# Patient Record
Sex: Male | Born: 2000 | Race: Black or African American | Hispanic: No | Marital: Single | Smoking: Former smoker
Health system: Southern US, Community
[De-identification: ages and names within clinical notes are randomized; demographics above are authoritative.]

## PROBLEM LIST (undated history)

## (undated) DIAGNOSIS — F913 Oppositional defiant disorder: Secondary | ICD-10-CM

## (undated) DIAGNOSIS — J45909 Unspecified asthma, uncomplicated: Secondary | ICD-10-CM

## (undated) DIAGNOSIS — F909 Attention-deficit hyperactivity disorder, unspecified type: Secondary | ICD-10-CM

## (undated) HISTORY — PX: TONSILLECTOMY: SUR1361

---
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
## 2020-10-01 ENCOUNTER — Other Ambulatory Visit: Payer: Self-pay

## 2020-10-01 ENCOUNTER — Emergency Department
Admission: EM | Admit: 2020-10-01 | Discharge: 2020-10-01 | Disposition: A | Discharge status: Home / Self Care | Payer: Medicaid Other | Attending: Emergency Medicine | Admitting: Emergency Medicine

## 2020-10-01 DIAGNOSIS — Z91040 Latex allergy status: Secondary | ICD-10-CM | POA: Diagnosis not present

## 2020-10-01 DIAGNOSIS — Z79899 Other long term (current) drug therapy: Secondary | ICD-10-CM | POA: Diagnosis not present

## 2020-10-01 DIAGNOSIS — Z202 Contact with and (suspected) exposure to infections with a predominantly sexual mode of transmission: Secondary | ICD-10-CM | POA: Insufficient documentation

## 2020-10-01 DIAGNOSIS — Z87891 Personal history of nicotine dependence: Secondary | ICD-10-CM | POA: Insufficient documentation

## 2020-10-01 DIAGNOSIS — J45909 Unspecified asthma, uncomplicated: Secondary | ICD-10-CM | POA: Diagnosis not present

## 2020-10-01 LAB — URINALYSIS, COMPLETE (UACMP) WITH MICROSCOPIC
Bacteria, UA: NONE SEEN
Bilirubin Urine: NEGATIVE
Glucose, UA: NEGATIVE mg/dL
Hgb urine dipstick: NEGATIVE
Ketones, ur: NEGATIVE mg/dL
Nitrite: NEGATIVE
Protein, ur: NEGATIVE mg/dL
Specific Gravity, Urine: 1.014 (ref 1.005–1.030)
Squamous Epithelial / LPF: NONE SEEN (ref 0–5)
pH: 7 (ref 5.0–8.0)

## 2020-10-01 LAB — CHLAMYDIA/NGC RT PCR (ARMC ONLY)
Chlamydia Tr: NOT DETECTED
N gonorrhoeae: NOT DETECTED

## 2020-10-01 MED ORDER — LIDOCAINE HCL (PF) 1 % IJ SOLN
5.0000 mL | Freq: Once | INTRAMUSCULAR | Status: AC
  Administered 2020-10-01: 5 mL
  Filled 2020-10-01: qty 5

## 2020-10-01 MED ORDER — CEFTRIAXONE SODIUM 1 G IJ SOLR
500.0000 mg | Freq: Once | INTRAMUSCULAR | Status: AC
  Administered 2020-10-01: 500 mg via INTRAMUSCULAR
  Filled 2020-10-01: qty 10

## 2020-10-01 MED ORDER — AZITHROMYCIN 1 G PO PACK: 1.0000 g | PACK | Freq: Once | ORAL | Status: DC

## 2020-10-01 MED ORDER — AZITHROMYCIN 500 MG PO TABS
ORAL_TABLET | ORAL | Status: AC
  Administered 2020-10-01: 1000 mg via ORAL
  Filled 2020-10-01: qty 2

## 2020-10-01 MED ORDER — AZITHROMYCIN 500 MG PO TABS
1000.0000 mg | ORAL_TABLET | Freq: Once | ORAL | Status: AC
Start: 2020-10-01 — End: 2020-10-01
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
## 2020-10-01 NOTE — Discharge Instructions (Addendum)
You have been treated for STDs with medication provided in the ED.  You may continue to monitor your pending lab results at this time.  Follow-up with [facility] health for ongoing testing.
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
## 2021-01-01 ENCOUNTER — Other Ambulatory Visit: Payer: Self-pay

## 2021-01-01 ENCOUNTER — Emergency Department (HOSPITAL_BASED_OUTPATIENT_CLINIC_OR_DEPARTMENT_OTHER)
Admission: EM | Admit: 2021-01-01 | Discharge: 2021-01-01 | Disposition: A | Discharge status: Home / Self Care | Payer: Medicaid Other | Attending: Emergency Medicine | Admitting: Emergency Medicine

## 2021-01-01 ENCOUNTER — Emergency Department (HOSPITAL_BASED_OUTPATIENT_CLINIC_OR_DEPARTMENT_OTHER): Payer: Medicaid Other

## 2021-01-01 ENCOUNTER — Encounter (HOSPITAL_BASED_OUTPATIENT_CLINIC_OR_DEPARTMENT_OTHER): Payer: Self-pay | Admitting: *Deleted

## 2021-01-01 DIAGNOSIS — S0990XA Unspecified injury of head, initial encounter: Secondary | ICD-10-CM | POA: Diagnosis present

## 2021-01-01 DIAGNOSIS — Y92410 Unspecified street and highway as the place of occurrence of the external cause: Secondary | ICD-10-CM | POA: Insufficient documentation

## 2021-01-01 DIAGNOSIS — Z91040 Latex allergy status: Secondary | ICD-10-CM | POA: Diagnosis not present

## 2021-01-01 DIAGNOSIS — J45909 Unspecified asthma, uncomplicated: Secondary | ICD-10-CM | POA: Insufficient documentation

## 2021-01-01 DIAGNOSIS — Z87891 Personal history of nicotine dependence: Secondary | ICD-10-CM | POA: Insufficient documentation

## 2021-01-01 MED ORDER — CYCLOBENZAPRINE HCL 10 MG PO TABS: 10.0000 mg | ORAL_TABLET | Freq: Two times a day (BID) | ORAL | 0 refills | Status: DC | PRN

## 2021-01-01 MED ORDER — IBUPROFEN 600 MG PO TABS: 600.0000 mg | ORAL_TABLET | Freq: Four times a day (QID) | ORAL | 0 refills | Status: DC | PRN
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
## 2021-01-01 NOTE — ED Notes (Signed)
Patient transported to CT at this Time. ?

## 2021-01-01 NOTE — ED Notes (Signed)
Patient returned from CT at this Time. ?

## 2021-01-01 NOTE — ED Notes (Signed)
This RN presented the AVS utilizing Teachback Method. Patient verbalizes understanding of Discharge Instructions. Opportunity for Questioning and Answers were provided. Patient Discharged from ED ambulatory to Home via SELF  
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
## 2021-04-24 ENCOUNTER — Other Ambulatory Visit: Payer: Self-pay

## 2021-04-24 ENCOUNTER — Emergency Department (HOSPITAL_COMMUNITY): Payer: Medicaid Other

## 2021-04-24 ENCOUNTER — Emergency Department (HOSPITAL_COMMUNITY)
Admission: EM | Admit: 2021-04-24 | Discharge: 2021-04-25 | Disposition: A | Discharge status: Home / Self Care | Payer: Medicaid Other | Attending: Emergency Medicine | Admitting: Emergency Medicine

## 2021-04-24 ENCOUNTER — Encounter (HOSPITAL_COMMUNITY): Payer: Self-pay | Admitting: Emergency Medicine

## 2021-04-24 DIAGNOSIS — M542 Cervicalgia: Secondary | ICD-10-CM | POA: Diagnosis not present

## 2021-04-24 DIAGNOSIS — R0789 Other chest pain: Secondary | ICD-10-CM | POA: Diagnosis not present

## 2021-04-24 DIAGNOSIS — Y92410 Unspecified street and highway as the place of occurrence of the external cause: Secondary | ICD-10-CM | POA: Insufficient documentation

## 2021-04-24 DIAGNOSIS — M549 Dorsalgia, unspecified: Secondary | ICD-10-CM | POA: Insufficient documentation

## 2021-04-24 DIAGNOSIS — R519 Headache, unspecified: Secondary | ICD-10-CM | POA: Diagnosis not present

## 2021-04-24 DIAGNOSIS — R109 Unspecified abdominal pain: Secondary | ICD-10-CM | POA: Insufficient documentation

## 2021-04-24 LAB — COMPREHENSIVE METABOLIC PANEL
ALT: 13 U/L (ref 0–44)
AST: 20 U/L (ref 15–41)
Albumin: 4.5 g/dL (ref 3.5–5.0)
Alkaline Phosphatase: 43 U/L (ref 38–126)
Anion gap: 7 (ref 5–15)
BUN: 7 mg/dL (ref 6–20)
CO2: 26 mmol/L (ref 22–32)
Calcium: 9 mg/dL (ref 8.9–10.3)
Chloride: 102 mmol/L (ref 98–111)
Creatinine, Ser: 1.12 mg/dL (ref 0.61–1.24)
GFR, Estimated: >60 mL/min (ref >60)
Glucose, Bld: 79 mg/dL (ref 70–99)
Potassium: 3.6 mmol/L (ref 3.5–5.1)
Sodium: 135 mmol/L (ref 135–145)
Total Bilirubin: 0.6 mg/dL (ref 0.3–1.2)
Total Protein: 7.3 g/dL (ref 6.5–8.1)

## 2021-04-24 LAB — CBC WITH DIFFERENTIAL/PLATELET
Abs Immature Granulocytes: 0.01 10*3/uL (ref 0.00–0.07)
Basophils Absolute: 0 10*3/uL (ref 0.0–0.1)
Basophils Relative: 1 %
Eosinophils Absolute: 0.1 10*3/uL (ref 0.0–0.5)
Eosinophils Relative: 2 %
HCT: 45.4 % (ref 39.0–52.0)
Hemoglobin: 14.9 g/dL (ref 13.0–17.0)
Immature Granulocytes: 0 %
Lymphocytes Relative: 42 %
Lymphs Abs: 2.1 10*3/uL (ref 0.7–4.0)
MCH: 28.3 pg (ref 26.0–34.0)
MCHC: 32.8 g/dL (ref 30.0–36.0)
MCV: 86.1 fL (ref 80.0–100.0)
Monocytes Absolute: 0.3 10*3/uL (ref 0.1–1.0)
Monocytes Relative: 7 %
Neutro Abs: 2.4 10*3/uL (ref 1.7–7.7)
Neutrophils Relative %: 48 %
Platelets: 188 10*3/uL (ref 150–400)
RBC: 5.27 MIL/uL (ref 4.22–5.81)
RDW: 13.4 % (ref 11.5–15.5)
WBC: 5 10*3/uL (ref 4.0–10.5)
nRBC: 0 % (ref 0.0–0.2)

## 2021-04-24 MED ORDER — KETOROLAC TROMETHAMINE 15 MG/ML IJ SOLN
15.0000 mg | Freq: Once | INTRAMUSCULAR | Status: AC
Start: 2021-04-24 — End: 2021-04-24
  Administered 2021-04-24: 15 mg via INTRAVENOUS
  Filled 2021-04-24: qty 1

## 2021-04-24 MED ORDER — METHOCARBAMOL 500 MG PO TABS
500.0000 mg | ORAL_TABLET | Freq: Once | ORAL | Status: AC
  Administered 2021-04-24: 500 mg via ORAL
  Filled 2021-04-24: qty 1

## 2021-04-24 MED ORDER — IOHEXOL 300 MG/ML  SOLN
100.0000 mL | Freq: Once | INTRAMUSCULAR | Status: AC | PRN
  Administered 2021-04-24: 100 mL via INTRAVENOUS
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
## 2021-04-25 LAB — URINALYSIS, ROUTINE W REFLEX MICROSCOPIC
Bacteria, UA: NONE SEEN
Bilirubin Urine: NEGATIVE
Glucose, UA: NEGATIVE mg/dL
Hgb urine dipstick: NEGATIVE
Ketones, ur: NEGATIVE mg/dL
Nitrite: NEGATIVE
Protein, ur: NEGATIVE mg/dL
Specific Gravity, Urine: 1.005 (ref 1.005–1.030)
pH: 6 (ref 5.0–8.0)

## 2021-04-25 LAB — SAMPLE TO BLOOD BANK

## 2021-04-25 MED ORDER — METHOCARBAMOL 500 MG PO TABS
500.0000 mg | ORAL_TABLET | Freq: Two times a day (BID) | ORAL | 0 refills | Status: AC | PRN
Start: 2021-04-25 — End: ?

## 2021-04-25 MED ORDER — NAPROXEN 500 MG PO TABS: 500.0000 mg | ORAL_TABLET | Freq: Two times a day (BID) | ORAL | 0 refills | Status: AC

## 2021-04-25 NOTE — Discharge Instructions (Signed)
Alternate ice and heat to areas of injury 3-4 times per day to limit inflammation and spasm.  Avoid strenuous activity and heavy lifting.  We recommend consistent use of naproxen in addition to Robaxin for muscle spasms. Do not drive or drink alcohol after taking Robaxin as it may make you drowsy and impair your judgment.  We recommend follow-up with a primary care doctor to ensure resolution of symptoms.  Return to the ED for any new or concerning symptoms. 
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
## 2021-04-30 ENCOUNTER — Emergency Department
Admission: EM | Admit: 2021-04-30 | Discharge: 2021-04-30 | Disposition: A | Discharge status: Home / Self Care | Payer: Medicaid Other | Attending: Emergency Medicine | Admitting: Emergency Medicine

## 2021-04-30 DIAGNOSIS — Z202 Contact with and (suspected) exposure to infections with a predominantly sexual mode of transmission: Secondary | ICD-10-CM

## 2021-04-30 DIAGNOSIS — R309 Painful micturition, unspecified: Secondary | ICD-10-CM | POA: Diagnosis not present

## 2021-04-30 LAB — URINALYSIS, ROUTINE W REFLEX MICROSCOPIC
Bilirubin Urine: NEGATIVE
Glucose, UA: NEGATIVE mg/dL
Leukocytes,Ua: NEGATIVE
Nitrite: NEGATIVE
Protein, ur: NEGATIVE mg/dL
Specific Gravity, Urine: 1.015 (ref 1.005–1.030)
Squamous Epithelial / HPF: NONE SEEN (ref 0–5)
pH: 6 (ref 5.0–8.0)

## 2021-04-30 LAB — CHLAMYDIA/NGC RT PCR (ARMC ONLY)
Chlamydia Tr: NOT DETECTED
N gonorrhoeae: NOT DETECTED

## 2021-04-30 MED ORDER — CEFTRIAXONE SODIUM 1 G IJ SOLR
500.0000 mg | Freq: Once | INTRAMUSCULAR | Status: AC
  Administered 2021-04-30: 500 mg via INTRAMUSCULAR
  Filled 2021-04-30: qty 10

## 2021-04-30 MED ORDER — DOXYCYCLINE HYCLATE 100 MG PO TABS: 100.0000 mg | ORAL_TABLET | Freq: Two times a day (BID) | ORAL | 0 refills | Status: AC

## 2021-04-30 NOTE — ED Triage Notes (Signed)
Pt found out today that he may have herpes due to information he learned from his sexual partner. Pt wants to get tested and treated but does not have any symptoms.

## 2021-04-30 NOTE — ED Provider Notes (Signed)
[facility] Provider Note  Patient Contact: 9:01 PM (approximate)   History   No chief complaint on file.   HPI  Darius Cross is a 21 y.o. male who presents emergency department for evaluation for possible herpes.  Patient states that his sexual partner 2 and half weeks ago was diagnosed with genital herpes.  He has no lesions, sores on his penis.  Patient states that he has had some burning with urination for the last several days.  He had a negative gonorrhea and chlamydia test at [facility] but continues to have symptoms and has not received any treatment.  Patient denies any abdominal pain, flank pain, hematuria.  No penile discharge.  Again patient has no genital lesions or sores.  No testicular pain.     Physical Exam   Triage Vital Signs: ED Triage Vitals  Enc Vitals Group     BP 04/30/21 1943 (!) 142/75     Pulse Rate 04/30/21 1943 70     Resp 04/30/21 1943 18     Temp 04/30/21 1943 98.7 F (37.1 C)     Temp Source 04/30/21 1943 Oral     SpO2 04/30/21 1943 98 %     Weight 04/30/21 1944 178 lb (80.7 kg)     Height --      Head Circumference --      Peak Flow --      Pain Score 04/30/21 1944 0     Pain Loc --      Pain Edu? --      Excl. in GC? --     Most recent vital signs: Vitals:   04/30/21 1943  BP: (!) 142/75  Pulse: 70  Resp: 18  Temp: 98.7 F (37.1 C)  SpO2: 98%     General: Alert and in no acute distress.  Cardiovascular:  Good peripheral perfusion Respiratory: Normal respiratory effort without tachypnea or retractions. Lungs CTAB.  Gastrointestinal: Bowel sounds 4 quadrants. Soft and nontender to palpation. No guarding or rigidity. No palpable masses. No distention. No CVA tenderness. Genitourinary: Patient declines exam at this time Musculoskeletal: Full range of motion to all extremities.  Neurologic:  No gross focal neurologic deficits are appreciated.  Skin:   No rash noted Other:   ED Results / Procedures /  Treatments   Labs (all labs ordered are listed, but only abnormal results are displayed) Labs Reviewed  CHLAMYDIA/NGC RT PCR ([facility] ONLY)            URINALYSIS, ROUTINE W REFLEX MICROSCOPIC     EKG     RADIOLOGY    No results found.  PROCEDURES:  Critical Care performed: No  Procedures   MEDICATIONS ORDERED IN ED: Medications  cefTRIAXone (ROCEPHIN) injection 500 mg (has no administration in time range)     IMPRESSION / MDM / ASSESSMENT AND PLAN / ED COURSE  I reviewed the triage vital signs and the nursing notes.                              Differential diagnosis includes, but is not limited to, urethritis, UTI, gonorrhea, chlamydia, herpes   Patient's diagnosis is consistent with dysuria, exposure to STD.  Patient presented the emergency department concerned that he may have herpes as he was exposed from a sexual partner 2 and half weeks ago.  He has no lesions or sores on the penis.  He has had dysuria over the  past 4 to 5 days.  Negative gonorrhea and Chlamydia test at [facility].  I will repeat urinalysis, gonorrhea and Chlamydia testing but will treat empirically at this time.  No concern for herpes as patient has no genital lesions.  No testicular pain.  Patient will have Rocephin, doxycycline.  Follow-up with primary care, health department as needed..  Patient is given ED precautions to return to the ED for any worsening or new symptoms.        FINAL CLINICAL IMPRESSION(S) / ED DIAGNOSES   Final diagnoses:  Exposure to STD     Rx / DC Orders   ED Discharge Orders          Ordered    doxycycline (VIBRA-TABS) 100 MG tablet  2 times daily        04/30/21 2108             Note:  This document was prepared using Dragon voice recognition software and may include unintentional dictation errors.   Brynda Peon 04/30/21 2108    Duffy Bruce, MD 04/30/21 2237
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
